# Patient Record
Sex: Female | Born: 1990 | Race: Black or African American | Hispanic: No | Marital: Single | Smoking: Never smoker
Health system: Southern US, Community
[De-identification: ages and names within clinical notes are randomized; demographics above are authoritative.]

---

## 2022-02-22 ENCOUNTER — Other Ambulatory Visit: Payer: Self-pay

## 2022-02-22 ENCOUNTER — Observation Stay
Admission: EM | Admit: 2022-02-22 | Discharge: 2022-02-24 | Disposition: A | Discharge status: Home / Self Care | Payer: Self-pay | Attending: Internal Medicine | Admitting: Internal Medicine

## 2022-02-22 ENCOUNTER — Emergency Department: Payer: Self-pay

## 2022-02-22 DIAGNOSIS — R0789 Other chest pain: Secondary | ICD-10-CM | POA: Insufficient documentation

## 2022-02-22 DIAGNOSIS — E785 Hyperlipidemia, unspecified: Secondary | ICD-10-CM | POA: Insufficient documentation

## 2022-02-22 DIAGNOSIS — E66813 Obesity, class 3: Secondary | ICD-10-CM | POA: Diagnosis present

## 2022-02-22 DIAGNOSIS — E1169 Type 2 diabetes mellitus with other specified complication: Principal | ICD-10-CM | POA: Insufficient documentation

## 2022-02-22 DIAGNOSIS — I10 Essential (primary) hypertension: Secondary | ICD-10-CM | POA: Insufficient documentation

## 2022-02-22 DIAGNOSIS — R079 Chest pain, unspecified: Secondary | ICD-10-CM | POA: Diagnosis present

## 2022-02-22 DIAGNOSIS — E119 Type 2 diabetes mellitus without complications: Secondary | ICD-10-CM

## 2022-02-22 HISTORY — DX: Morbid (severe) obesity due to excess calories: E66.01

## 2022-02-22 LAB — TROPONIN I (HIGH SENSITIVITY)
Troponin I (High Sensitivity): 13 ng/L (ref <18)
Troponin I (High Sensitivity): 27 ng/L — ABNORMAL HIGH (ref <18)
Troponin I (High Sensitivity): 33 ng/L — ABNORMAL HIGH (ref <18)

## 2022-02-22 LAB — POC URINE PREG, ED: Preg Test, Ur: NEGATIVE

## 2022-02-22 LAB — BASIC METABOLIC PANEL
Anion gap: 11 (ref 5–15)
BUN: 9 mg/dL (ref 6–20)
CO2: 22 mmol/L (ref 22–32)
Calcium: 9.1 mg/dL (ref 8.9–10.3)
Chloride: 101 mmol/L (ref 98–111)
Creatinine, Ser: 0.7 mg/dL (ref 0.44–1.00)
GFR, Estimated: >60 mL/min (ref >60)
Glucose, Bld: 429 mg/dL — ABNORMAL HIGH (ref 70–99)
Potassium: 3.9 mmol/L (ref 3.5–5.1)
Sodium: 134 mmol/L — ABNORMAL LOW (ref 135–145)

## 2022-02-22 LAB — HEMOGLOBIN A1C
Hgb A1c MFr Bld: 13.4 % — ABNORMAL HIGH (ref 4.8–5.6)
Mean Plasma Glucose: 337.88 mg/dL

## 2022-02-22 LAB — CBC
HCT: 46.1 % — ABNORMAL HIGH (ref 36.0–46.0)
Hemoglobin: 15.8 g/dL — ABNORMAL HIGH (ref 12.0–15.0)
MCH: 29.8 pg (ref 26.0–34.0)
MCHC: 34.3 g/dL (ref 30.0–36.0)
MCV: 87 fL (ref 80.0–100.0)
Platelets: 262 10*3/uL (ref 150–400)
RBC: 5.3 MIL/uL — ABNORMAL HIGH (ref 3.87–5.11)
RDW: 12.1 % (ref 11.5–15.5)
WBC: 8.1 10*3/uL (ref 4.0–10.5)
nRBC: 0 % (ref 0.0–0.2)

## 2022-02-22 LAB — HIV ANTIBODY (ROUTINE TESTING W REFLEX): HIV Screen 4th Generation wRfx: NONREACTIVE

## 2022-02-22 LAB — CBG MONITORING, ED: Glucose-Capillary: 340 mg/dL — ABNORMAL HIGH (ref 70–99)

## 2022-02-22 MED ORDER — ASPIRIN 81 MG PO CHEW
324.0000 mg | CHEWABLE_TABLET | Freq: Once | ORAL | Status: DC
  Filled 2022-02-22: qty 4

## 2022-02-22 MED ORDER — INSULIN ASPART 100 UNIT/ML IJ SOLN
0.0000 [IU] | Freq: Three times a day (TID) | INTRAMUSCULAR | Status: DC
  Administered 2022-02-22 – 2022-02-23 (×2): 7 [IU] via SUBCUTANEOUS
  Administered 2022-02-23: 5 [IU] via SUBCUTANEOUS
  Administered 2022-02-23: 9 [IU] via SUBCUTANEOUS
  Administered 2022-02-24: 3 [IU] via SUBCUTANEOUS
  Administered 2022-02-24: 7 [IU] via SUBCUTANEOUS
  Filled 2022-02-22 (×6): qty 1

## 2022-02-22 MED ORDER — ENOXAPARIN SODIUM 60 MG/0.6ML IJ SOSY: 0.5000 mg/kg | PREFILLED_SYRINGE | INTRAMUSCULAR | Status: DC

## 2022-02-22 MED ORDER — ACETAMINOPHEN 325 MG PO TABS: 650.0000 mg | ORAL_TABLET | ORAL | Status: DC | PRN

## 2022-02-22 MED ORDER — NITROGLYCERIN 0.4 MG SL SUBL: 0.4000 mg | SUBLINGUAL_TABLET | SUBLINGUAL | Status: DC | PRN

## 2022-02-22 MED ORDER — ATORVASTATIN CALCIUM 20 MG PO TABS
20.0000 mg | ORAL_TABLET | Freq: Every day | ORAL | Status: DC
  Administered 2022-02-22 – 2022-02-23 (×2): 20 mg via ORAL
  Filled 2022-02-22 (×2): qty 1

## 2022-02-22 MED ORDER — ONDANSETRON HCL 4 MG/2ML IJ SOLN: 4.0000 mg | Freq: Four times a day (QID) | INTRAMUSCULAR | Status: DC | PRN

## 2022-02-22 MED ORDER — SODIUM CHLORIDE 0.9 % IV BOLUS
1000.0000 mL | Freq: Once | INTRAVENOUS | Status: AC
  Administered 2022-02-22: 1000 mL via INTRAVENOUS

## 2022-02-22 MED ORDER — METFORMIN HCL 500 MG PO TABS
1000.0000 mg | ORAL_TABLET | Freq: Once | ORAL | Status: AC
  Administered 2022-02-22: 1000 mg via ORAL
  Filled 2022-02-22: qty 2

## 2022-02-22 MED ORDER — ASPIRIN EC 81 MG PO TBEC
81.0000 mg | DELAYED_RELEASE_TABLET | Freq: Every day | ORAL | Status: DC
  Administered 2022-02-23 – 2022-02-24 (×2): 81 mg via ORAL
  Filled 2022-02-22 (×2): qty 1

## 2022-02-22 MED ORDER — ZOLPIDEM TARTRATE 5 MG PO TABS: 5.0000 mg | ORAL_TABLET | Freq: Every evening | ORAL | Status: DC | PRN

## 2022-02-22 NOTE — ED Provider Notes (Signed)
Medical screening examination/treatment/procedure(s) were conducted as a shared visit with non-physician practitioner(s) and myself.  I personally evaluated the patient during the encounter. ? ?  ?Delman Kitten, MD ?02/22/22 1550 ? ?

## 2022-02-22 NOTE — Assessment & Plan Note (Signed)
Body mass index is 43.6 kg/m?Marland Kitchen ?Complicates overall care and prognosis.  Recommend lifestyle modifications including physical activity and diet for weight loss and overall long-term health. ? ?

## 2022-02-22 NOTE — ED Provider Notes (Addendum)
HEAR Score: 2  ? ?Patient's heart path indicates recommendation for admission for observation.  The patient has a significant delta rise in her troponin.  Admission consult has been placed with Dr. Esaw Grandchild, hospitalist. ?  Sharyn Creamer, MD ?02/22/22 1507 ? ? ?----------------------------------------- ?3:38 PM on 02/22/2022 ?----------------------------------------- ?Dr. Denton Lank, hospitalist has requested a third troponin be sent and reassessment of admission.  I did advocate and recommended strongly the patient be admitted due to a recommendation for chest pain observation via the heart pathway and rise in her troponin.  At this time, await formal consultation from Dr. Denton Lank and repeat troponin.  Dr. Lenard Lance to follow-up on this discussion, but again recommendation is for admission to the hospital, which I conveyed to the hospitalist.  ? ?Patient patient reports she did take aspirin prior to arrival today ?  ?Sharyn Creamer, MD ?02/22/22 1539 ? ?  ?Sharyn Creamer, MD ?02/22/22 1539 ? ?

## 2022-02-22 NOTE — H&P (Addendum)
?History and Physical  ? ? ?Patient: Julie Jenkins [id number] DOB: 07/26/1991 ?DOA: 02/22/2022 ?DOS: the patient was seen and examined on 02/22/2022 ?PCP: Pcp, No  ?Patient coming from: Home ? ?Chief Complaint:  ?Chief Complaint  ?Patient presents with  ? Shortness of Breath  ? Chest Pain  ? ?HPI: Julie Jenkins is a 31 y.o. female with medical history significant of morbid obesity presented to the ED today with ongoing exertional chest pain for about 1 week.  Patient reports initial onset last Saturday when walking into work, resolved spontaneously after a few minutes.  She describes it as midsternal, sharp at times usually stops when she rests.  Denies any shortness of breath nausea vomiting, dizziness lightheadedness, diaphoresis.  She does report that some of these episodes have happened when she has been emotional or upset, however she is also noted onset during physical exertion at work.  Episodes usually self resolve with rest and trying to relax.  Longest episode around 20 minutes. ? ?No personal cardiac history, but does report sister age 64 who required cardiac stent recently.  Also reports mother and maternal aunt with history of heart failure, mother possibly blood clot or peripheral vascular disease (unclear).  No personal history of blood clots.  Not currently on any hormonal birth control.  She denies any prior diagnosis of diabetes. ? ?In the ED, patient's labs showed initial normal troponin of 13, 2 hours later minimally elevated at 27.  EKG normal sinus rhythm without any acute ischemic changes.  Metabolic panel notable for glucose of 429 without any prior diagnosed diabetes.  CBC notable only for hemoconcentration with hemoglobin 15.8.  Chest x-ray degraded by body habitus but read as negative. ? ? ? ? ?Review of Systems: As mentioned in the history of present illness. All other systems reviewed and are negative. ? ?Past Medical History:  ?Diagnosis Date  ? Morbid obesity (HCC)   ? ? ?History  reviewed. No pertinent surgical history. ? ? ?Social History:  reports that she has never smoked. She has never used smokeless tobacco. She reports current alcohol use of about 4.0 standard drinks per week. She reports current drug use. Drug: Marijuana. ? ? ?No Known Allergies ? ? ?Family History  ?Problem Relation Age of Onset  ? Heart failure Mother   ? Peripheral vascular disease Mother   ? CAD Sister   ? Heart failure Maternal Aunt   ? ? ?Prior to Admission medications   ?Not on File  ? ? ?Physical Exam: ?Vitals:  ? 02/22/22 1530 02/22/22 1545 02/22/22 1600 02/22/22 1615  ?BP: (!) 125/92     ?Pulse: 79 90 90 92  ?Resp: 16 18 (!) 23 10  ?Temp:      ?TempSrc:      ?SpO2: 97% 98% 97% 96%  ?Weight:      ?Height:      ? ?General exam: awake, alert, no acute distress, morbidly obese ?HEENT: moist mucus membranes, hearing grossly normal  ?Respiratory system: CTAB diminished due to body habitus, no wheezes, rales or rhonchi, normal respiratory effort. ?Cardiovascular system: normal S1/S2, RRR, no pedal edema.   ?Gastrointestinal system: soft, NT, ND, no HSM felt, +bowel sounds. ?Central nervous system: A&O x3. no gross focal neurologic deficits, normal speech ?Extremities: moves all, no edema, normal tone ?Skin: dry, intact, normal temperature, normal color, No rashes, lesions or ulcers seen on visualized skin ?Psychiatry: normal mood, congruent affect, judgement and insight appear normal ? ? ? ?Data Reviewed: ? ? ? ?Labs reviewed &  notable for - Troponin 13 >> 27 ?Sodium 134 ?Glucose 429 ?Hbg 15.8 ? ?EKG - NSR 90 bpm, no ST elevation or other acute ischemic changes ? ? ?Assessment and Plan: ?* Chest pain ?History as above.  Chest pain has been both exertional and at times related to psychological stressors.  Given morbid obesity and underlying undiagnosed diabetes in addition to family cardiac history, admitted for observation and further evaluation. ?Patient free of chest pain during admission encounter. ? ?In the  ED: ?Initial troponin 13, repeat 27. ?EKG normal sinus rhythm without acute ischemic changes. ? ?-- Trend troponins ?-- Stat EKG if recurrent chest pain ?-- Sublingual nitro as needed ?-- Started on aspirin and statin  ?-- Telemetry ?-- A1c, lipid panel pending ?-- Outpatient cardiology follow-up versus consult depending on clinical course.  Will need echo, possibly stress test. ? ?Obesity, Class III, BMI 40-49.9 (morbid obesity) (HCC) ?Body mass index is 43.6 kg/m?Marland Kitchen ?Complicates overall care and prognosis.  Recommend lifestyle modifications including physical activity and diet for weight loss and overall long-term health. ? ? ?Type 2 diabetes mellitus (HCC) ?Glucose 429 in the ED ?No prior diagnosis of diabetes. ?Given metformin in the ED. ? ?-- Needs to establish PCP, TOC consulted ?-- A1c pending ?-- Sliding scale NovoLog for now ? ? ? ? ? ? Advance Care Planning:   Code Status: Full Code  ? ?Consults: none ? ?Family Communication: none ? ?Severity of Illness: ?The appropriate patient status for this patient is OBSERVATION. Observation status is judged to be reasonable and necessary in order to provide the required intensity of service to ensure the patient's safety. The patient's presenting symptoms, physical exam findings, and initial radiographic and laboratory data in the context of their medical condition is felt to place them at decreased risk for further clinical deterioration. Furthermore, it is anticipated that the patient will be medically stable for discharge from the hospital within 2 midnights of admission.  ? ?Author: ?Pennie Banter, DO ?02/22/2022 4:20 PM ? ?For on call review [email or web address].  ?

## 2022-02-22 NOTE — ED Provider Notes (Signed)
? ?[facility] ?Emergency Department Provider Note ? ?____________________________________________ ? ? Event Date/Time  ? First MD Initiated Contact with Patient 02/22/22 1212   ?  (approximate) ? ?I have reviewed the triage vital signs and the nursing notes. ? ? ?HISTORY ? ?Chief Complaint ?Shortness of Breath and Chest Pain ? ? ?HPI ?Jodie EchevariaSamantha Boccio is a 31 y.o. female with a history of no chronic medical issues presents to the emergency department for treatment and evaluation of intermittent chest pain that has been present for the past week.  She initially noted pain when she was walking into work last Saturday.  Pain lasted for a few minutes and went away.  Pain is sometimes sharp but usually midsternal pressure. Pain/pressure seems to stop when she rests. No shortness of breath, nausea, vomiting, diaphoresis, or other symptoms of concern.  No cardiac history.  No history of DVT/PE.  She is not currently on birth control.  ? ?Past Medical History:  ?Diagnosis Date  ? Morbid obesity (HCC)   ? ? ?Patient Active Problem List  ? Diagnosis Date Noted  ? Chest pain 02/22/2022  ? Type 2 diabetes mellitus (HCC) 02/22/2022  ? Obesity, Class III, BMI 40-49.9 (morbid obesity) (HCC) 02/22/2022  ? ? ?Prior to Admission medications   ?Not on File  ? ? ?Allergies ?Patient has no known allergies. ? ?Family History  ?Problem Relation Age of Onset  ? Heart failure Mother   ? Peripheral vascular disease Mother   ? CAD Sister   ? Heart failure Maternal Aunt   ? ? ?Social History ?Social History  ? ?Tobacco Use  ? Smoking status: Never  ? Smokeless tobacco: Never  ?Substance Use Topics  ? Alcohol use: Yes  ?  Alcohol/week: 4.0 standard drinks  ?  Types: 4 Cans of beer per week  ? Drug use: Yes  ?  Types: Marijuana  ?____________________________________________ ? ? ?PHYSICAL EXAM: ? ?VITAL SIGNS: ?ED Triage Vitals  ?Enc Vitals Group  ?   BP 02/22/22 1159 (!) 127/92  ?   Pulse Rate 02/22/22 1159 91  ?   Resp  02/22/22 1159 18  ?   Temp 02/22/22 1159 98.5 ?F (36.9 ?C)  ?   Temp Source 02/22/22 1159 Oral  ?   SpO2 02/22/22 1159 95 %  ?   Weight 02/22/22 1154 254 lb (115.2 kg)  ?   Height 02/22/22 1154 5\' 4"  (1.626 m)  ?   Head Circumference --   ?   Peak Flow --   ?   Pain Score 02/22/22 1154 10  ?   Pain Loc --   ?   Pain Edu? --   ?   Excl. in GC? --   ? ? ?Constitutional: Alert and oriented.  Overall well appearing and in no acute distress.  Normal mental status. ?Eyes: Conjunctivae are normal. PERRL. ?Head: Atraumatic. ?Nose: No congestion/rhinnorhea. ?Mouth/Throat: Mucous membranes are moist.  Oropharynx non-erythematous. Tongue normal in size and color. ?Neck: No stridor.  No carotid bruit appreciated on exam. ?Hematological/Lymphatic/Immunilogical: No cervical lymphadenopathy. ?Cardiovascular: Normal rate, regular rhythm. Grossly normal heart sounds.  Good peripheral circulation. ?Respiratory: Normal respiratory effort.  No retractions. Lungs CTAB. ?Gastrointestinal: Soft and nontender. No distention. No abdominal bruits. No CVA tenderness. ?Genitourinary: Exam deferred. ?Musculoskeletal: No lower extremity tenderness.  No edema of extremities. ?Neurologic:  Normal speech and language. No gross focal neurologic deficits are appreciated. ?Skin:  Skin is warm, dry and intact. No rash noted. ?Psychiatric: Mood and affect  are normal. Speech and behavior are normal. ? ?____________________________________________ ?  ?LABS ?(all labs ordered are listed, but only abnormal results are displayed) ? ?Labs Reviewed  ?BASIC METABOLIC PANEL - Abnormal; Notable for the following components:  ?    Result Value  ? Sodium 134 (*)   ? Glucose, Bld 429 (*)   ? All other components within normal limits  ?CBC - Abnormal; Notable for the following components:  ? RBC 5.30 (*)   ? Hemoglobin 15.8 (*)   ? HCT 46.1 (*)   ? All other components within normal limits  ?CBG MONITORING, ED - Abnormal; Notable for the following components:  ?  Glucose-Capillary 340 (*)   ? All other components within normal limits  ?TROPONIN I (HIGH SENSITIVITY) - Abnormal; Notable for the following components:  ? Troponin I (High Sensitivity) 27 (*)   ? All other components within normal limits  ?HIV ANTIBODY (ROUTINE TESTING W REFLEX)  ?HEMOGLOBIN A1C  ?LIPID PANEL  ?CBC  ?BASIC METABOLIC PANEL  ?POC URINE PREG, ED  ?TROPONIN I (HIGH SENSITIVITY)  ?TROPONIN I (HIGH SENSITIVITY)  ? ?____________________________________________ ? ?EKG ? ?ED ECG REPORT ?I, Kem Boroughs, FNP-BC personally viewed and interpreted this ECG. ? ? Date: 02/22/2022 ? EKG Time: 1157 ? Rate: 90 ? Rhythm: normal EKG, normal sinus rhythm ? Axis: normal ? Intervals:none ? ST&T Change: no ? ?____________________________________________ ? ?RADIOLOGY ? ?ED MD interpretation: Chest x-ray shows no acute cardiopulmonary abnormality. ? ?I, Kem Boroughs, personally viewed and evaluated these images (plain radiographs) as part of my medical decision making, as well as reviewing the written report by the radiologist. ? ?Official radiology report(s): ?DG Chest 2 View ? ?Result Date: 02/22/2022 ?CLINICAL DATA:  Chest pain and shortness of breath. EXAM: CHEST - 2 VIEW COMPARISON:  None. FINDINGS: Examination is degraded due to patient body habitus. Normal cardiac silhouette and mediastinal contours. No discrete focal airspace opacities. No pleural effusion or pneumothorax. No evidence of edema. No acute osseous abnormalities. IMPRESSION: No acute cardiopulmonary disease on this body habitus degraded examination. Electronically Signed   By: Simonne Come M.D.   On: 02/22/2022 12:14   ? ?____________________________________________ ? ? ?PROCEDURES ? ?Procedure(s) performed: None ? ?Procedures ? ?Critical Care performed: No ? ?____________________________________________ ? ? ?INITIAL IMPRESSION / ASSESSMENT AND PLAN  ? ?31 year old female presenting to the emergency department for treatment and evaluation of  intermittent chest pain that has been present off and on since last Saturday that is worse with exertion.  See HPI for further details. ? ?Exam is overall reassuring.  PERC negative. ?  ?Differential diagnosis includes, but not limited to: ? ?Differential includes, but is not limited to, viral syndrome, bronchitis including COPD exacerbation, reactive airway disease including asthma, CHF including exacerbation with or without pulmonary/interstitial edema, pneumothorax, ACS, thoracic trauma, and pulmonary embolism, ACS, aortic dissection, pulmonary embolism, cardiac tamponade, pneumothorax, pneumonia, pericarditis, myocarditis, GI-related causes including esophagitis/gastritis, and musculoskeletal chest wall pain.   ? ?ED COURSE ? ?Labs reviewed and are significant for a glucose of 429 within normal anion gap.  CBC unremarkable.  Troponin is normal.  Chest x-ray is without acute cardiopulmonary abnormality.  EKG is unremarkable showing a normal sinus rhythm. ? ?Symptoms most likely related to new diagnosis of type 2 diabetes.  We will give 1 L of fluids, recheck CBG and plan to start her on metformin.  Lab results discussed with the patient.  She was advised that she will need to establish primary care.  She does not  currently have any medical insurance.  And a list of community resources will be provided.  Dietary changes were discussed with the patient as well. ? ?----------------------------------------- ?2:40 PM on 02/22/2022 ?----------------------------------------- ?Repeat troponin elevated from 13 to 27. Discussed with ED attending, Dr. Fanny Bien who recommends admission for exertional chest pain and new onset diabetes. Plan discussed with patient who agrees to stay. Hospitalist consult requested. ?  ? ?FINAL CLINICAL IMPRESSION(S) / ED DIAGNOSES ? ?Final diagnoses:  ?New onset type 2 diabetes mellitus (HCC)  ?Chest pain, unspecified type  ? ? ?ED Discharge Orders   ? ?      Ordered  ?  POCT CBG (Fasting -  Glucose)       ? 02/22/22 1413  ? ?  ?  ? ?  ? ? ?As part of my medical decision making, I reviewed the following data within the electronic MEDICAL RECORD NUMBER Labs reviewed , EKG interpreted NSR, and Evaluated by EM attendin

## 2022-02-22 NOTE — Assessment & Plan Note (Addendum)
History as above.  Chest pain has been both exertional and at times related to psychological stressors.  Given morbid obesity and underlying undiagnosed diabetes in addition to family cardiac history, admitted for observation and further evaluation. ?Troponin trended peaked at 33, normalized this morning at 11. ?Echocardiogram with normal EF, no diastolic dysfunction or valvular pathology ? ?-- Started on aspirin and statin  ?-- Outpatient cardiology follow-up ?

## 2022-02-22 NOTE — ED Triage Notes (Signed)
Pt comes pov with cp and shob since last Saturday. Upper left chest pain waving across chest. Pain to back as well.  ?

## 2022-02-22 NOTE — Assessment & Plan Note (Addendum)
Glucose 429 in the ED. ?Hemoglobin A1c 13.4% ?No prior diagnosis of diabetes. ?Given metformin in the ED. ? ?-- Does not have PCP, TOC consulted ?-- Discharging on Basaglar 15 units twice daily and metformin 500 mg twice daily ?--Patient advised to keep a log of blood sugar readings at home to bring to primary care follow-up.  She is agreeable ?-- Diabetes educator consulted ?

## 2022-02-22 NOTE — Progress Notes (Signed)
PHARMACIST - PHYSICIAN COMMUNICATION ? ?CONCERNING:  Enoxaparin (Lovenox) for DVT Prophylaxis  ? ?DESCRIPTION: ?Patient was prescribed enoxaprin 40mg  q24 hours for VTE prophylaxis.  ? Weights  ? 02/22/22 1154  ?Weight: 115.2 kg (254 lb)  ? ? ?Body mass index is 43.6 kg/m?. ? ?Estimated Creatinine Clearance: 125.7 mL/min (by C-G formula based on SCr of 0.7 mg/dL). ? ? ?Based on [facility] policy patient is candidate for enoxaparin 0.5mg /kg TBW SQ every 24 hours based on BMI being >30. ? ?RECOMMENDATION: ?Pharmacy has adjusted enoxaparin dose per [facility] policy. ? ?Patient is now receiving enoxaparin 57.5 mg every 24 hours  ? ? ?[facility], PharmD ?Clinical Pharmacist  ?02/22/2022 ?3:34 PM ? ?

## 2022-02-23 ENCOUNTER — Observation Stay (HOSPITAL_BASED_OUTPATIENT_CLINIC_OR_DEPARTMENT_OTHER)
Admit: 2022-02-23 | Discharge: 2022-02-23 | Disposition: A | Discharge status: Home / Self Care | Payer: Self-pay | Attending: Internal Medicine | Admitting: Internal Medicine

## 2022-02-23 DIAGNOSIS — E1165 Type 2 diabetes mellitus with hyperglycemia: Secondary | ICD-10-CM

## 2022-02-23 DIAGNOSIS — E785 Hyperlipidemia, unspecified: Secondary | ICD-10-CM

## 2022-02-23 DIAGNOSIS — I10 Essential (primary) hypertension: Secondary | ICD-10-CM | POA: Diagnosis present

## 2022-02-23 DIAGNOSIS — R079 Chest pain, unspecified: Secondary | ICD-10-CM

## 2022-02-23 DIAGNOSIS — E1169 Type 2 diabetes mellitus with other specified complication: Secondary | ICD-10-CM

## 2022-02-23 LAB — CBC
HCT: 43.9 % (ref 36.0–46.0)
Hemoglobin: 15 g/dL (ref 12.0–15.0)
MCH: 29.8 pg (ref 26.0–34.0)
MCHC: 34.2 g/dL (ref 30.0–36.0)
MCV: 87.3 fL (ref 80.0–100.0)
Platelets: 247 10*3/uL (ref 150–400)
RBC: 5.03 MIL/uL (ref 3.87–5.11)
RDW: 12.5 % (ref 11.5–15.5)
WBC: 8.1 10*3/uL (ref 4.0–10.5)
nRBC: 0 % (ref 0.0–0.2)

## 2022-02-23 LAB — BASIC METABOLIC PANEL
Anion gap: 8 (ref 5–15)
BUN: 8 mg/dL (ref 6–20)
CO2: 25 mmol/L (ref 22–32)
Calcium: 8.8 mg/dL — ABNORMAL LOW (ref 8.9–10.3)
Chloride: 102 mmol/L (ref 98–111)
Creatinine, Ser: 0.62 mg/dL (ref 0.44–1.00)
GFR, Estimated: >60 mL/min (ref >60)
Glucose, Bld: 308 mg/dL — ABNORMAL HIGH (ref 70–99)
Potassium: 3.8 mmol/L (ref 3.5–5.1)
Sodium: 135 mmol/L (ref 135–145)

## 2022-02-23 LAB — LIPID PANEL
Cholesterol: 209 mg/dL — ABNORMAL HIGH (ref 0–200)
HDL: 24 mg/dL — ABNORMAL LOW (ref >40)
LDL Cholesterol: 123 mg/dL — ABNORMAL HIGH (ref 0–99)
Total CHOL/HDL Ratio: 8.7 RATIO
Triglycerides: 310 mg/dL — ABNORMAL HIGH (ref <150)
VLDL: 62 mg/dL — ABNORMAL HIGH (ref 0–40)

## 2022-02-23 LAB — ECHOCARDIOGRAM COMPLETE
AR max vel: 1.99 cm2
AV Peak grad: 5.6 mmHg
Ao pk vel: 1.18 m/s
Area-P 1/2: 3.53 cm2
Calc EF: 57.1 %
Height: 64 in
S' Lateral: 2.85 cm
Single Plane A2C EF: 60.7 %
Single Plane A4C EF: 56.4 %
Weight: 4064 oz

## 2022-02-23 LAB — GLUCOSE, CAPILLARY
Glucose-Capillary: 300 mg/dL — ABNORMAL HIGH (ref 70–99)
Glucose-Capillary: 317 mg/dL — ABNORMAL HIGH (ref 70–99)
Glucose-Capillary: 370 mg/dL — ABNORMAL HIGH (ref 70–99)
Glucose-Capillary: 326 mg/dL — ABNORMAL HIGH (ref 70–99)
Glucose-Capillary: 273 mg/dL — ABNORMAL HIGH (ref 70–99)
Glucose-Capillary: 222 mg/dL — ABNORMAL HIGH (ref 70–99)
Glucose-Capillary: 234 mg/dL — ABNORMAL HIGH (ref 70–99)

## 2022-02-23 LAB — TROPONIN I (HIGH SENSITIVITY): Troponin I (High Sensitivity): 11 ng/L (ref <18)

## 2022-02-23 MED ORDER — METFORMIN HCL 500 MG PO TABS
500.0000 mg | ORAL_TABLET | Freq: Two times a day (BID) | ORAL | Status: DC
  Administered 2022-02-23 – 2022-02-24 (×2): 500 mg via ORAL
  Filled 2022-02-23 (×2): qty 1

## 2022-02-23 MED ORDER — PERFLUTREN LIPID MICROSPHERE
1.0000 mL | INTRAVENOUS | Status: AC | PRN
  Administered 2022-02-23: 3 mL via INTRAVENOUS
  Filled 2022-02-23: qty 10

## 2022-02-23 MED ORDER — LOSARTAN POTASSIUM 25 MG PO TABS
25.0000 mg | ORAL_TABLET | Freq: Every day | ORAL | Status: DC
  Administered 2022-02-23: 25 mg via ORAL
  Filled 2022-02-23: qty 1

## 2022-02-23 MED ORDER — ATORVASTATIN CALCIUM 20 MG PO TABS
40.0000 mg | ORAL_TABLET | Freq: Every day | ORAL | Status: DC
  Administered 2022-02-24: 40 mg via ORAL
  Filled 2022-02-23: qty 2

## 2022-02-23 MED ORDER — INSULIN DETEMIR 100 UNIT/ML ~~LOC~~ SOLN
10.0000 [IU] | Freq: Two times a day (BID) | SUBCUTANEOUS | Status: DC
  Administered 2022-02-23: 10 [IU] via SUBCUTANEOUS
  Filled 2022-02-23 (×2): qty 0.1

## 2022-02-23 MED ORDER — INSULIN DETEMIR 100 UNIT/ML ~~LOC~~ SOLN
8.0000 [IU] | Freq: Every day | SUBCUTANEOUS | Status: DC
  Administered 2022-02-23: 8 [IU] via SUBCUTANEOUS
  Filled 2022-02-23: qty 0.08

## 2022-02-23 NOTE — Hospital Course (Signed)
Julie Jenkins is a 31 y.o. female with medical history significant of morbid obesity presented to the ED today with ongoing exertional chest pain for about 1 week.   ? ?In the ED, initial hs-troponin normal at 13, repeat mildly elevated 27.  EKG unremarkable.  Labs notable for glucose of 429 without any prior diagnosis diabetes. ? ?Admitted for observation and further evaluation of chest pain.  Troponin peaked at 33 and normalized.  Echocardiogram unremarkable, normal EF and no diastolic dysfunction or valvular pathology. ? ?Regarding new onset diabetes, hemoglobin A1c 13.4%. ?Starting on insulin and metformin, diabetes educator consulted.  TOC consulted for assistance with PCP and medications given patient is uninsured. ? ? ?

## 2022-02-23 NOTE — Progress Notes (Signed)
?Progress Note ? ? ?Patient: Julie Jenkins [id number]:[id number] DOB: [birth year] DOA: 02/22/2022     0 ?DOS: the patient was seen and examined on 02/23/2022 ?  ?Brief hospital course: ?Julie Jenkins is a 31 y.o. female with medical history significant of morbid obesity presented to the ED today with ongoing exertional chest pain for about 1 week.   ? ?In the ED, initial hs-troponin normal at 13, repeat mildly elevated 27.  EKG unremarkable.  Labs notable for glucose of 429 without any prior diagnosis diabetes. ? ?Admitted for observation and further evaluation of chest pain.  Troponin peaked at 33 and normalized.  Echocardiogram unremarkable, normal EF and no diastolic dysfunction or valvular pathology. ? ?Regarding new onset diabetes, hemoglobin A1c 13.4%. ?Starting on insulin and metformin, diabetes educator consulted.  TOC consulted for assistance with PCP and medications given patient is uninsured. ? ? ? ?Assessment and Plan: ?* Chest pain ?History as above.  Chest pain has been both exertional and at times related to psychological stressors.  Given morbid obesity and underlying undiagnosed diabetes in addition to family cardiac history, admitted for observation and further evaluation. ?Troponin trended peaked at 33, normalized this morning at 11. ?Echocardiogram with normal EF, no diastolic dysfunction or valvular pathology ? ?3/26: Patient reported a brief episode of chest discomfort when she was up to the bathroom this morning ? ?-- Stat EKG if recurrent chest pain ?-- Sublingual nitro as needed ?-- Started on aspirin and statin  ?-- Telemetry ?-- Outpatient cardiology follow-up ? ?Essential hypertension ?No prior formal diagnosis.   ?BPs have been in systolic 150s to 546T, diastolic upper 80s to as high as 115. ?--Started on losartan ?-- BP and adjust as needed ?--Close primary care follow-up ? ?Of note, BP was controlled on admission, elevated BP could be related to stress/anxiety of hospital admission.  Caution  not to overtreat.  Recommend ambulatory BP monitoring at home.   ? ?Hyperlipidemia due to type 2 diabetes mellitus (HCC) ?Started on Lipitor 40 mg ? ?Obesity, Class III, BMI 40-49.9 (morbid obesity) (HCC) ?Body mass index is 43.6 kg/m?Marland Kitchen ?Complicates overall care and prognosis.  Recommend lifestyle modifications including physical activity and diet for weight loss and overall long-term health. ? ? ?Type 2 diabetes mellitus (HCC) ?Glucose 429 in the ED. ?Hemoglobin A1c 13.4% ?No prior diagnosis of diabetes. ?Given metformin in the ED. ? ?-- Needs to establish PCP, TOC consulted ?-- Uninsured, prescriptions to medication management on discharge ?--Start Levemir 8 units this morning, this afternoon increased to 10 units twice daily given glucose still in the mid 300s ?-- Sliding scale NovoLog ?-- Started on metformin 500 mg twice daily ?-- Monitor CBGs ? ? ? ? ?  ? ?Subjective: Patient awake sitting up in bed when seen this morning.  Reports having a episode of chest discomfort when she was up to the bathroom earlier this morning it resolved more quickly than prior episodes.  She sat down to rest and take deep breaths and it subsided.  Denies any shortness of breath dizziness lightheadedness or diaphoresis with this.  She seems a little overwhelmed by the diabetes diagnosis and idea of being on insulin.  Assured diabetes educator will assist in preparing her for this. ? ? ?Physical Exam: ?Vitals:  ? 02/22/22 2331 02/23/22 0454 02/23/22 0752 02/23/22 1204  ?BP: (!) 162/90 (!) 163/115 (!) 155/99 (!) 157/88  ?Pulse: 64 76 72 68  ?Resp: 19 19 19 18   ?Temp: 98.5 ?F (36.9 ?C) 97.6 ?F (36.4 ?C) 97.9 ?F (  36.6 ?C) 98.4 ?F (36.9 ?C)  ?TempSrc:      ?SpO2: 96% 100% 100% 98%  ?Weight:      ?Height:      ? ?General exam: awake, alert, no acute distress, obese ?HEENT: moist mucus membranes, hearing grossly normal  ?Respiratory system: normal respiratory effort at rest, on room air. ?Cardiovascular system: normal S1/S2, RRR, no pedal  edema.   ?Central nervous system: A&O x3. no gross focal neurologic deficits, normal speech ?Extremities: moves all, no edema, normal tone ?Skin: dry, intact, normal temperature ?Psychiatry: normal mood, congruent affect, judgement and insight appear normal ? ? ? ?Data Reviewed: ? ?Labs reviewed notable for troponin normalized 11, glucose 308 on metabolic panel with CBGs 300, 176, 370, 326.  Lipid panel cholesterol 209, HDL 24, LDL 123, triglycerides 310, VLDL 62.  CBC normal.  Hemoglobin A1c 13.4% ? ?Echocardiogram: ? 1. Left ventricular ejection fraction, by estimation, is 60 to 65%. The  ?left ventricle has normal function. The left ventricle has no regional  ?wall motion abnormalities. Left ventricular diastolic parameters were  ?normal.  ? 2. Right ventricular systolic function is normal. The right ventricular  ?size is normal. There is normal pulmonary artery systolic pressure. The  ?estimated right ventricular systolic pressure is 24.5 mmHg.  ? 3. Left atrial size was mildly dilated.  ? 4. The mitral valve is normal in structure. No evidence of mitral valve  ?regurgitation. No evidence of mitral stenosis.  ? 5. The aortic valve was not well visualized. Aortic valve regurgitation  ?is not visualized. No aortic stenosis is present.  ? 6. The inferior vena cava is normal in size with greater than 50%  ?respiratory variability, suggesting right atrial pressure of 3 mmHg.  ? ? ? ?Disposition: ?Status is: Observation ?The patient remains OBS appropriate and will d/c before 2 midnights. ?-- Blood sugars remain uncontrolled, starting on insulin given A1c above 13 ?--Requires diabetes education ?--Med management not open on weekends for patient to get prescriptions today ? ? ? Planned Discharge Destination: Home ? ? ? ?Time spent: 35 minutes ? ?Author: ?Pennie Banter, DO ?02/23/2022 3:51 PM ? ?For on call review [email or web address].  ?

## 2022-02-23 NOTE — Assessment & Plan Note (Signed)
Started on Lipitor 40 mg ?

## 2022-02-23 NOTE — Progress Notes (Signed)
*  PRELIMINARY RESULTS* ?Echocardiogram ?2D Echocardiogram has been performed. Definity IV ultrasound enhancing agent used on this study. ? ?Julie Jenkins ?02/23/2022, 10:24 AM ?

## 2022-02-23 NOTE — Assessment & Plan Note (Addendum)
No prior formal diagnosis.   ?BPs have been in systolic 150s to 546E, diastolic upper 80s to as high as 115. ? ?--Started on losartan and HCTZ ?--Close primary care follow-up ?

## 2022-02-24 ENCOUNTER — Other Ambulatory Visit: Payer: Self-pay

## 2022-02-24 DIAGNOSIS — I10 Essential (primary) hypertension: Secondary | ICD-10-CM

## 2022-02-24 LAB — GLUCOSE, CAPILLARY
Glucose-Capillary: 268 mg/dL — ABNORMAL HIGH (ref 70–99)
Glucose-Capillary: 306 mg/dL — ABNORMAL HIGH (ref 70–99)

## 2022-02-24 MED ORDER — INSULIN STARTER KIT- PEN NEEDLES (ENGLISH)
1.0000 | Freq: Once | Status: AC
  Administered 2022-02-24: 1
  Filled 2022-02-24: qty 1

## 2022-02-24 MED ORDER — INSULIN PEN NEEDLE 32G X 4 MM MISC
1.0000 | Freq: Three times a day (TID) | 1 refills | Status: AC
  Filled 2022-02-24: qty 100, 25d supply, fill #0

## 2022-02-24 MED ORDER — METHOCARBAMOL 500 MG PO TABS: 500.0000 mg | ORAL_TABLET | Freq: Three times a day (TID) | ORAL | Status: DC | PRN

## 2022-02-24 MED ORDER — LOSARTAN POTASSIUM 50 MG PO TABS
50.0000 mg | ORAL_TABLET | Freq: Every day | ORAL | Status: DC
  Administered 2022-02-24: 50 mg via ORAL
  Filled 2022-02-24: qty 1

## 2022-02-24 MED ORDER — LIVING WELL WITH DIABETES BOOK
Freq: Once | Status: AC
  Filled 2022-02-24: qty 1

## 2022-02-24 MED ORDER — INSULIN DETEMIR 100 UNIT/ML ~~LOC~~ SOLN: 20.0000 [IU] | Freq: Two times a day (BID) | SUBCUTANEOUS | Status: DC

## 2022-02-24 MED ORDER — METHOCARBAMOL 500 MG PO TABS
500.0000 mg | ORAL_TABLET | Freq: Three times a day (TID) | ORAL | 0 refills | Status: DC | PRN
  Filled 2022-02-24: qty 30, 10d supply, fill #0

## 2022-02-24 MED ORDER — HYDROCHLOROTHIAZIDE 25 MG PO TABS
25.0000 mg | ORAL_TABLET | Freq: Every day | ORAL | Status: DC
  Administered 2022-02-24: 25 mg via ORAL
  Filled 2022-02-24: qty 1

## 2022-02-24 MED ORDER — RIGHTEST GL300 LANCETS MISC
0 refills | Status: AC
  Filled 2022-02-24: qty 100, 25d supply, fill #0

## 2022-02-24 MED ORDER — INSULIN DETEMIR 100 UNIT/ML ~~LOC~~ SOLN
15.0000 [IU] | Freq: Two times a day (BID) | SUBCUTANEOUS | Status: DC
  Administered 2022-02-24: 15 [IU] via SUBCUTANEOUS
  Filled 2022-02-24 (×2): qty 0.15

## 2022-02-24 MED ORDER — BASAGLAR KWIKPEN 100 UNIT/ML ~~LOC~~ SOPN
15.0000 [IU] | PEN_INJECTOR | Freq: Two times a day (BID) | SUBCUTANEOUS | 2 refills | Status: AC
  Filled 2022-02-24: qty 15, 50d supply, fill #0

## 2022-02-24 MED ORDER — RIGHTEST GM550 BLOOD GLUCOSE W/DEVICE KIT
PACK | 0 refills | Status: AC
  Filled 2022-02-24: qty 1, 30d supply, fill #0

## 2022-02-24 MED ORDER — ASPIRIN 81 MG PO TBEC
81.0000 mg | DELAYED_RELEASE_TABLET | Freq: Every day | ORAL | 2 refills | Status: AC
  Filled 2022-02-24: qty 30, 30d supply, fill #0

## 2022-02-24 MED ORDER — LOSARTAN POTASSIUM-HCTZ 100-25 MG PO TABS
1.0000 | ORAL_TABLET | Freq: Every day | ORAL | 2 refills | Status: AC
  Filled 2022-02-24: qty 30, 30d supply, fill #0

## 2022-02-24 MED ORDER — HYDRALAZINE HCL 20 MG/ML IJ SOLN: 5.0000 mg | Freq: Four times a day (QID) | INTRAMUSCULAR | Status: DC | PRN

## 2022-02-24 MED ORDER — RIGHTEST GS550 BLOOD GLUCOSE VI STRP
ORAL_STRIP | 0 refills | Status: AC
  Filled 2022-02-24: qty 100, 25d supply, fill #0

## 2022-02-24 MED ORDER — METFORMIN HCL 500 MG PO TABS
500.0000 mg | ORAL_TABLET | Freq: Two times a day (BID) | ORAL | 2 refills | Status: AC
  Filled 2022-02-24: qty 60, 30d supply, fill #0

## 2022-02-24 MED ORDER — BLOOD GLUCOSE MONITOR KIT: PACK | 0 refills | Status: AC

## 2022-02-24 MED ORDER — ATORVASTATIN CALCIUM 40 MG PO TABS
40.0000 mg | ORAL_TABLET | Freq: Every day | ORAL | 2 refills | Status: AC
  Filled 2022-02-24: qty 30, 30d supply, fill #0

## 2022-02-24 NOTE — Progress Notes (Signed)
Pt Discharged, all belongings with pt. ?

## 2022-02-24 NOTE — Progress Notes (Signed)
Inpatient Diabetes Program Recommendations ? ?AACE/ADA: New Consensus Statement on Inpatient Glycemic Control  ? ?Target Ranges:  Prepandial:   less than 140 mg/dL ?     Peak postprandial:   less than 180 mg/dL (1-2 hours) ?     Critically ill patients:  140 - 180 mg/dL  ? ? Latest Reference Range & Units 02/24/22 08:50 02/24/22 11:21  ?Glucose-Capillary 70 - 99 mg/dL 268 (H) 306 (H)  ? ? Latest Reference Range & Units 02/22/22 12:01 02/22/22 16:38 02/23/22 04:50  ?Glucose 70 - 99 mg/dL 429 (H)  308 (H)  ?Hemoglobin A1C 4.8 - 5.6 %  13.4 (H)   ? ?Review of Glycemic Control ? ?Diabetes history: No (newly dx with DM this admission) ?Outpatient Diabetes medications: NA ?Current orders for Inpatient glycemic control: Levemir 15 units BID, Novolog 0-9 units TID with meals, Metformin 500 mg BID ? ?NOTE: Spoke with patient about new diabetes diagnosis.  Patient reports that she has several family members with DM and she has worked at a [employer] so she has checked glucose of other people before but not her own.  Discussed A1C results (13.4% on 02/22/22) and explained what an A1C is and informed patient that current A1C indicates an average glucose of 338 mg/dl over the past 2-3 months. Discussed basic pathophysiology of DM Type 2, basic home care, importance of checking CBGs and maintaining good CBG control to prevent long-term and short-term complications. Reviewed glucose and A1C goals.  Reviewed signs and symptoms of hyperglycemia and hypoglycemia along with treatment for both. Discussed impact of nutrition, exercise, stress, sickness, and medications on diabetes control. Discussed Carb Modified diet and informed patient that RD was consulted and should be talking with her as well regarding Carb modified diet. Reviewed Living Well with diabetes booklet and encouraged patient to read through entire book. Informed patient that she will be prescribed basal insulin (Basaglar) and Metformin. Discussed Basaglar and Metformin.  Informed of potential GI side effects of Metformin.  Explained that since she is uninsured, TOC has been consulted and should be assisting with follow up and medications from [facility]. Also asked TOC to provide glucometer and testing supplies.  Reviewed and demonstrated how to draw up and administer insulin with vial and syringe and how to use insulin pen. Educated patient on insulin pen use at home. Reviewed contents of insulin flexpen starter kit. Reviewed all steps of insulin pen including attachment of needle, 2-unit air shot, dialing up dose, giving injection, removing needle, disposal of sharps, storage of unused insulin, disposal of insulin etc. Patient able to provide successful return demonstration. Patient states she prefers to use insulin pens outpatient.  Asked patient to check glucose 3-4 times per day and asked that she take glucometer with her to follow up appointments. Informed patient that RN will be asking her to self-administer insulin to ensure proper technique and ability to administer self insulin shots. Patient has already given herself one insulin injection today. Encouraged patient to get established with local clinic (TOC to provide information on clinic) and consistently follow up to ensure she can continue to get needed DM medications and supplies.  Patient verbalized understanding of information discussed and she states that she has no further questions at this time related to diabetes.   RNs to provide ongoing basic DM education at bedside with this patient and engage patient to actively check blood glucose and administer insulin injections. At time of discharge please provide Rx for Basaglar pens [phone]), insulin  pen needles [phone]), and the Metformin. ? ?Thanks, ?Barnie Alderman, RN, MSN, CDE ?Diabetes Coordinator ?Inpatient Diabetes Program ?[phone] (Team Pager from 8am to 5pm) ? ? ? ? ?

## 2022-02-24 NOTE — TOC CM/SW Note (Addendum)
Patient left before glucose monitor/supplies and PCP resources could be given. Left her a voicemail. ? ?Charlynn Court, CSW ?[phone] ? ?3:59 pm: Received call back from patient. She will come pick up glucose monitor at front desk at [facility] entrance. Also included PCP packet and intake paperwork for [facility]. ? ?Charlynn Court, CSW ?[phone] ? ?

## 2022-02-24 NOTE — Discharge Summary (Addendum)
?Physician Discharge Summary ?  ?Patient: Julie Jenkins MRN: [id number] DOB: 13-Nov-1991  ?Admit date:     02/22/2022  ?Discharge date: 02/24/2022  ?Discharge Physician: Ezekiel Slocumb  ? ?PCP: Pcp, No  ? ?Recommendations at discharge:  ? ?Follow-up with primary care in 1 to 2 weeks or soonest available appointment ?Follow-up on glycemic control ?Follow-up on blood pressure control ?Repeat CBC, BMP, Mg in 1 to 2 weeks at follow-up ? ? ?Discharge Diagnoses: ?Principal Problem: ?  Chest pain ?Active Problems: ?  Type 2 diabetes mellitus ([place]) ?  Obesity, Class III, BMI 40-49.9 (morbid obesity) ([place]) ?  Hyperlipidemia due to type 2 diabetes mellitus ([place]) ?  Essential hypertension ? ? ?Hospital Course: ?Julie Jenkins is a 31 y.o. female with medical history significant of morbid obesity presented to the ED today with ongoing exertional chest pain for about 1 week.   ? ?In the ED, initial hs-troponin normal at 13, repeat mildly elevated 27.  EKG unremarkable.  Labs notable for glucose of 429 without any prior diagnosis diabetes. ? ?Admitted for observation and further evaluation of chest pain.  Troponin peaked at 33 and normalized.  Echocardiogram unremarkable, normal EF and no diastolic dysfunction or valvular pathology. ? ?Regarding new onset diabetes, hemoglobin A1c 13.4%. ?Starting on insulin and metformin, diabetes educator consulted.  TOC consulted for assistance with PCP and medications given patient is uninsured. ? ? ? ?Assessment and Plan: ?* Chest pain ?History as above.  Chest pain has been both exertional and at times related to psychological stressors.  Given morbid obesity and underlying undiagnosed diabetes in addition to family cardiac history, admitted for observation and further evaluation. ?Troponin trended peaked at 33, normalized this morning at 11. ?Echocardiogram with normal EF, no diastolic dysfunction or valvular pathology ? ?-- Started on aspirin and statin  ?-- Outpatient cardiology  follow-up ? ?Essential hypertension ?No prior formal diagnosis.   ?BPs have been in systolic 628B to 151V, diastolic upper 61Y to as high as 115. ? ?--Started on losartan and HCTZ ?--Close primary care follow-up ? 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 ?Obesity, Class III, BMI 40-49.9 (morbid obesity) ([place]) ?Body mass index is 43.6 kg/m?Marland Kitchen ?Complicates overall care and prognosis.  Recommend lifestyle modifications including physical activity and diet for weight loss and overall long-term health. ? ? ?Type 2 diabetes mellitus ([place]) ?Glucose 429 in the ED. ?Hemoglobin A1c 13.4% ?No prior diagnosis of diabetes. ?Given metformin in the ED. ? ?-- Does not have PCP, TOC consulted ?-- Discharging on Basaglar 15 units twice daily and metformin 500 mg twice daily ?--Patient advised to keep a log of blood sugar readings at home to bring to primary care follow-up.  She is agreeable ?-- Diabetes educator consulted ? ? ? ? ?  ? ? ?Consultants: Diabetes coordinator ?Procedures performed: None ?Disposition: Home ?Diet recommendation:  ?Discharge Diet Orders (From admission, onward)  ? ?  Start     Ordered  ? 02/24/22 0000  Diet - low sodium heart healthy       ? 02/24/22 1350  ? ?  ?  ? ?  ? ?Carb modified diet ?DISCHARGE MEDICATION: ?Allergies as of 02/24/2022   ?No Known Allergies ?  ? ?  ?Medication List  ?  ? ?TAKE these medications   ? ?Aspirin Adult Low Strength 81 MG EC tablet ?Generic drug: aspirin ?Take 1 tablet (81 mg total) by mouth once daily. Swallow whole. ?  ?atorvastatin 40 MG tablet ?  Commonly known as: LIPITOR ?Take 1 tablet (40 mg total) by mouth once daily. ?  ?Basaglar KwikPen 100 UNIT/ML ?Inject 15 Units into the skin 2 (two) times daily. ?  ?blood glucose meter kit and supplies Kit ?Dispense based on patient and insurance preference. Use up to four times daily as directed. ?  ?Comfort EZ Pen Needles 32G X 4 MM Misc ?Generic drug: Insulin Pen Needle ?Use as directed  with insulin pen. ?  ?losartan-hydrochlorothiazide 100-25 MG tablet ?Commonly known as: Hyzaar ?Take 1 tablet by mouth once daily. ?  ?metFORMIN 500 MG tablet ?Commonly known as: GLUCOPHAGE ?Take 1 tablet (500 mg total) by mouth 2 (two) times daily with a meal. ?  ?methocarbamol 500 MG tablet ?Commonly known as: ROBAXIN ?Take 1 tablet (500 mg total) by mouth every 8 (eight) hours as needed for muscle spasms. ?  ? ?  ? ?ASK your doctor about these medications   ? ?Rightest GL300 Lancets Misc ?Use up to 4 times daily as directed. ?  ?Rightest GM550 Blood Glucose w/Device Kit ?Use up to 4 times daily as directed. ?  ?Rightest GS550 Blood Glucose test strip ?Generic drug: glucose blood ?Use up to 4 times daily as directed. ?  ? ?  ? ? Follow-up Information   ? ? [facility]. Call.   ?Specialty: Primary Care ?Why: Establish with primary care, new diagnoses of diabetes and hypertension ?Contact information: ?[address] ?[address] ?[place] ?[phone] ? ?  ?  ? ? Minna Merritts, MD. Call.   ?Specialty: Cardiology ?Why: As needed, If symptoms worsen.  If having ongoing chest pain, heart palpitations (racing or fluttering sensation), recommend following up with cardiology for ambulatory heart monitor. ?Contact information: ?[address] ?[place] [address] ?[phone] ? ? ?  ?  ? ?  ?  ? ?  ? ?Discharge Exam: ?Julie Jenkins Weights  ? 02/22/22 1154  ?Weight: 115.2 kg  ? ?General exam: awake, alert, no acute distress, obese ?HEENT: atraumatic, clear conjunctiva, anicteric sclera, moist mucus membranes, hearing grossly normal  ?Respiratory system: CTAB, no wheezes, rales or rhonchi, normal respiratory effort. ?Cardiovascular system: normal S1/S2, RRR, no JVD, murmurs, rubs, gallops,  no pedal edema.   ?Gastrointestinal system: soft, NT, ND, no HSM felt, +bowel sounds. ?Central nervous system: A&O x4. no gross focal neurologic deficits, normal speech ?Extremities:  moves all, no edema, normal tone ?Skin: dry, intact, normal temperature, normal color, No rashes, lesions or ulcers ?Psychiatry: normal mood, congruent affect, judgement and insight appear normal ? ? ?Condition at discharge: stable ? ?The results of significant diagnostics from this hospitalization (including imaging, microbiology, ancillary and laboratory) are listed below for reference.  ? ?Imaging Studies: ?DG Chest 2 View ? ?Result Date: 02/22/2022 ?CLINICAL DATA:  Chest pain and shortness of breath. EXAM: CHEST - 2 VIEW COMPARISON:  None. FINDINGS: Examination is degraded due to patient body habitus. Normal cardiac silhouette and mediastinal contours. No discrete focal airspace opacities. No pleural effusion or pneumothorax. No evidence of edema. No acute osseous abnormalities. IMPRESSION: No acute cardiopulmonary disease on this body habitus degraded examination. Electronically Signed   By: Sandi Mariscal M.D.   On: 02/22/2022 12:14  ? ?ECHOCARDIOGRAM COMPLETE ? ?Result Date: 02/23/2022 ?   ECHOCARDIOGRAM REPORT   Patient Name:   NATACHA JEPSEN Date of Exam: 02/23/2022 Medical Rec #:  [id number]       Height:       64.0 in Accession #:  [id number]      Weight:       254.0 lb Date of Birth:  1991-06-16        BSA:          2.166 m? Patient Age:    89 years        BP:           155/99 mmHg Patient Gender: F               HR:           85 bpm. Exam Location:  [facility] Procedure: 2D Echo and Intracardiac Opacification Agent Indications:     Chest Pain R07.9  History:         Patient has no prior history of Echocardiogram examinations.  Sonographer:     Kathlen Brunswick RDCS Referring Phys:  [id number] Claiborne Billings A Jaylene Arrowood Diagnosing Phys: Ida Rogue MD  Sonographer Comments: Technically difficult study due to poor echo windows. IMPRESSIONS  1. Left ventricular ejection fraction, by estimation, is 60 to 65%. The left ventricle has normal function. The left ventricle has no regional wall motion abnormalities. Left ventricular  diastolic parameters were normal.  2. Right ventricular systolic function is normal. The right ventricular size is normal. There is normal pulmonary artery systolic pressure. The estimated right ventricular systolic

## 2022-03-07 ENCOUNTER — Other Ambulatory Visit: Payer: Self-pay

## 2022-03-07 ENCOUNTER — Encounter: Payer: Self-pay | Admitting: Emergency Medicine

## 2022-03-07 ENCOUNTER — Emergency Department
Admission: EM | Admit: 2022-03-07 | Discharge: 2022-03-07 | Disposition: A | Discharge status: Home / Self Care | Payer: Self-pay | Attending: Emergency Medicine | Admitting: Emergency Medicine

## 2022-03-07 ENCOUNTER — Emergency Department: Payer: Self-pay

## 2022-03-07 DIAGNOSIS — M940 Chondrocostal junction syndrome [Tietze]: Secondary | ICD-10-CM | POA: Insufficient documentation

## 2022-03-07 MED ORDER — MELOXICAM 15 MG PO TABS: 15.0000 mg | ORAL_TABLET | Freq: Every day | ORAL | 0 refills | Status: AC

## 2022-03-07 MED ORDER — METHOCARBAMOL 500 MG PO TABS
500.0000 mg | ORAL_TABLET | Freq: Four times a day (QID) | ORAL | 0 refills | Status: AC
Start: 2022-03-07 — End: ?

## 2022-03-07 MED ORDER — KETOROLAC TROMETHAMINE 30 MG/ML IJ SOLN
30.0000 mg | Freq: Once | INTRAMUSCULAR | Status: AC
  Administered 2022-03-07: 30 mg via INTRAMUSCULAR
  Filled 2022-03-07: qty 1

## 2022-03-07 NOTE — ED Notes (Signed)
Patient transported to X-ray 

## 2022-03-07 NOTE — ED Provider Notes (Signed)
? ?[facility] ?Provider Note ? ?Patient Contact: 7:11 PM (approximate) ? ? ?History  ? ?Back Pain ? ? ?HPI ? ?Julie Jenkins is a 31 y.o. female who presents the emergency department complaining of right rib pain.  Patient was seen in this department 2 weeks ago, was admitted for slightly elevated troponin with reports of chest pain and shortness of breath.  Patient states that symptoms really began 3 weeks ago after she sneezed.  She had a sharp pain/popping sensation in her right chest wall and had had ongoing pain and shortness of breath since.  Patient was evaluated while in the hospital with cardiology and determined that source of pain was not heart related.  Patient was placed on a muscle relaxer for her rib pain and states that this intermittently helped but she has had persistent pain in the same distribution since her discharge.  There is no other associated complaints at this time.  She is here primarily for her rib pain. ?  ? ? ?Physical Exam  ? ?Triage Vital Signs: ?ED Triage Vitals  ?Enc Vitals Group  ?   BP 03/07/22 1834 (!) 158/90  ?   Pulse Rate 03/07/22 1834 71  ?   Resp 03/07/22 1834 18  ?   Temp 03/07/22 1834 98.3 ?F (36.8 ?C)  ?   Temp Source 03/07/22 1834 Oral  ?   SpO2 03/07/22 1834 97 %  ?   Weight 03/07/22 1831 253 lb 8.5 oz (115 kg)  ?   Height 03/07/22 1831 5\' 4"  (1.626 m)  ?   Head Circumference --   ?   Peak Flow --   ?   Pain Score 03/07/22 1831 7  ?   Pain Loc --   ?   Pain Edu? --   ?   Excl. in [place]? --   ? ? ?Most recent vital signs: ?Vitals:  ? 03/07/22 1834  ?BP: (!) 158/90  ?Pulse: 71  ?Resp: 18  ?Temp: 98.3 ?F (36.8 ?C)  ?SpO2: 97%  ? ? ? ?General: Alert and in no acute distress.  ?Neck: No stridor. No cervical spine tenderness to palpation.  ?Cardiovascular:  Good peripheral perfusion ?Respiratory: Normal respiratory effort without tachypnea or retractions. Lungs CTAB. Good air entry to the bases with no decreased or absent breath  sounds. ?Gastrointestinal: Bowel sounds ?4 quadrants. Soft and nontender to palpation. No guarding or rigidity. No palpable masses. No distention. No CVA tenderness. ?Musculoskeletal: Full range of motion to all extremities.  Patient with no visible abnormalities to the chest wall.  She is tender to palpation along the fifth rib and intercostal margin rating from the front of her chest through to the posterior chest wall.  No other tenderness to palpation.  However palpation does reproduce the patient's symptoms. ?Neurologic:  No gross focal neurologic deficits are appreciated.  ?Skin:   No rash noted ?Other: ? ? ?ED Results / Procedures / Treatments  ? ?Labs ?(all labs ordered are listed, but only abnormal results are displayed) ?Labs Reviewed - No data to display ? ? ?EKG ? ? ? ? ?RADIOLOGY ? ?I personally viewed and evaluated these images as part of my medical decision making, as well as reviewing the written report by the radiologist. ? ?ED Provider Interpretation: No acute findings specifically no rib fracture and no cardiopulmonary abnormality on chest x-ray with rib series. ? ?DG Ribs Unilateral W/Chest Right ? ?Result Date: 03/07/2022 ?CLINICAL DATA:  Right chest pain EXAM: RIGHT RIBS AND  CHEST - 3+ VIEW COMPARISON:  02/22/2022 FINDINGS: Transverse diameter of heart is increased. Central pulmonary vessels are prominent. There is no focal pulmonary consolidation. There is no pleural effusion or pneumothorax. No displaced fractures are seen in the right ribs. IMPRESSION: No displaced fractures are seen in the right ribs. There is no focal pulmonary consolidation. There is no pleural effusion or pneumothorax. Electronically Signed   By: Elmer Picker M.D.   On: 03/07/2022 19:55   ? ?PROCEDURES: ? ?Critical Care performed: No ? ?Procedures ? ? ?MEDICATIONS ORDERED IN ED: ?Medications  ?ketorolac (TORADOL) 30 MG/ML injection 30 mg (has no administration in time range)  ? ? ? ?IMPRESSION / MDM / ASSESSMENT AND  PLAN / ED COURSE  ?I reviewed the triage vital signs and the nursing notes. ?             ?               ? ?Differential diagnosis includes, but is not limited to, costochondritis, rib fracture ? ? ?Patient's diagnosis is consistent with right rib pain.  Patient developed right rib pain roughly 3 weeks ago after sneezing.  She was seen 2 weeks ago, was admitted due to slightly elevated troponins.  She had a reassuring cardiac work-up and the majority of her symptoms have improved with the exception of her ongoing rib pain.  Patient states that she had intermittent relief from muscle relaxer but the symptoms never went away.  She was tender in the intercostal margins.  X-ray was performed today with no acute findings.  No concern for cardiac involvement given her recent reassuring work-up and reproducible palpable symptoms on exam.  Patient will be given Toradol, meloxicam at home..  Follow-up with primary care as needed.  Patient is given ED precautions to return to the ED for any worsening or new symptoms. ? ? ? ?  ? ? ?FINAL CLINICAL IMPRESSION(S) / ED DIAGNOSES  ? ?Final diagnoses:  ?Costochondritis  ? ? ? ?Rx / DC Orders  ? ?ED Discharge Orders   ? ?      Ordered  ?  meloxicam (MOBIC) 15 MG tablet  Daily       ? 03/07/22 2031  ?  methocarbamol (ROBAXIN) 500 MG tablet  4 times daily       ? 03/07/22 2031  ? ?  ?  ? ?  ? ? ? ?Note:  This document was prepared using Dragon voice recognition software and may include unintentional dictation errors. ?  ?Darletta Moll, PA-C ?03/07/22 2038 ? ?  ?Arta Silence, MD ?03/07/22 2300 ? ?

## 2022-03-07 NOTE — ED Triage Notes (Signed)
First Nurse:  C/O mid - upper back pain and upper chest pain x 2 weeks.  States has been seen and treated for same, given muscle relaxers that are not helping.  States has not been to work in 2 days. ? ?AAOx3.  Skin warm and dyr. NAD.  Posture upright and relaxed. NAD ?

## 2022-04-10 ENCOUNTER — Telehealth: Payer: Self-pay | Admitting: Pharmacy Technician

## 2022-04-10 ENCOUNTER — Other Ambulatory Visit: Payer: Self-pay

## 2022-04-10 NOTE — Telephone Encounter (Signed)
Patient only signed DOH Attestation.  Would need to provide current year's household income if PAP medications were needed. ? ?Leib Elahi J. Zahir Eisenhour ?Patient Advocate Specialist ?[facility]  ?

## 2022-12-08 ENCOUNTER — Other Ambulatory Visit: Payer: Self-pay

## 2023-05-14 IMAGING — CR DG RIBS W/ CHEST 3+V*R*
3 series · 3 of 3 positions shown · non-contrast
Comparison: 02/22/2022

CLINICAL DATA: Right chest pain

EXAM:
RIGHT RIBS AND CHEST - 3+ VIEW

[chest pa]
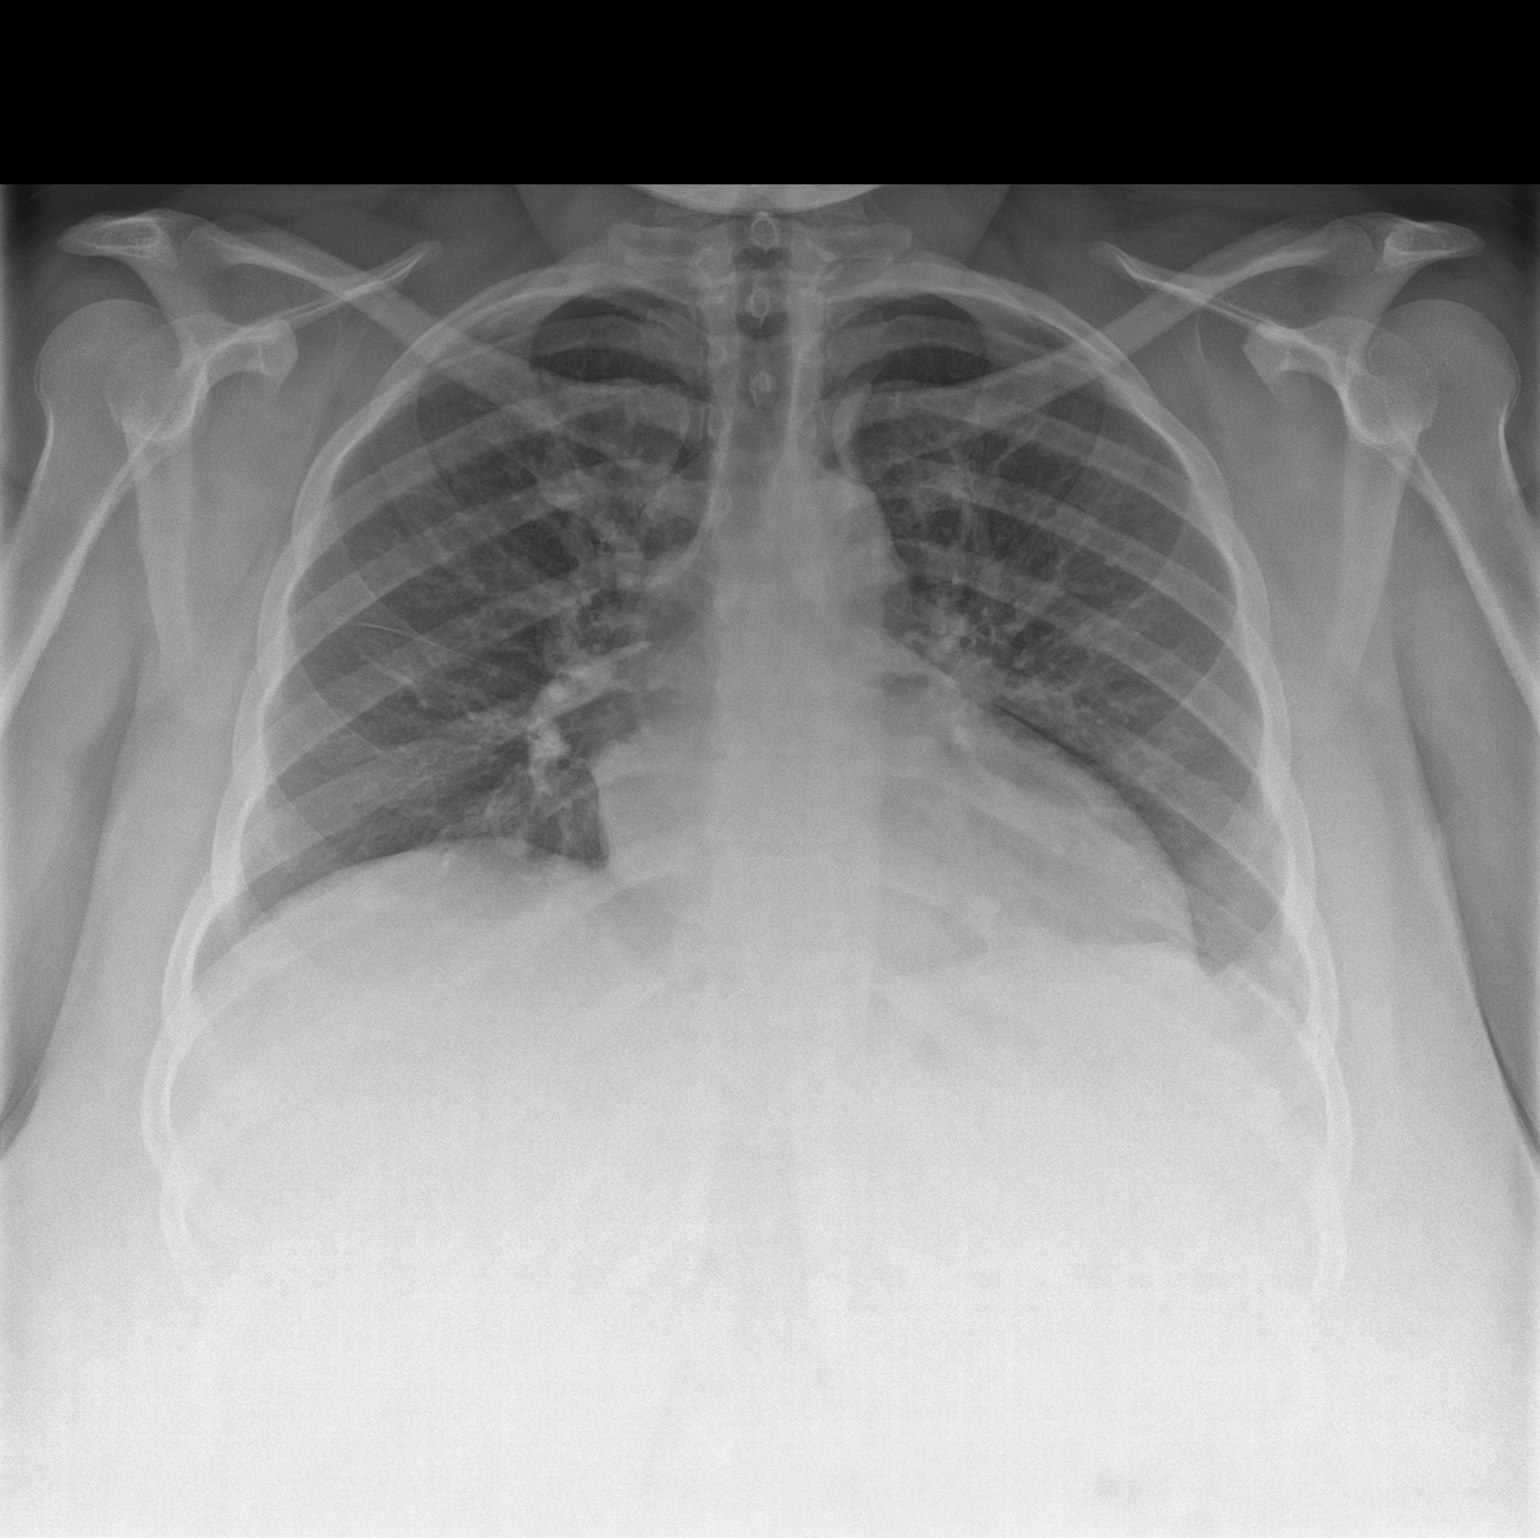

[rib ap]
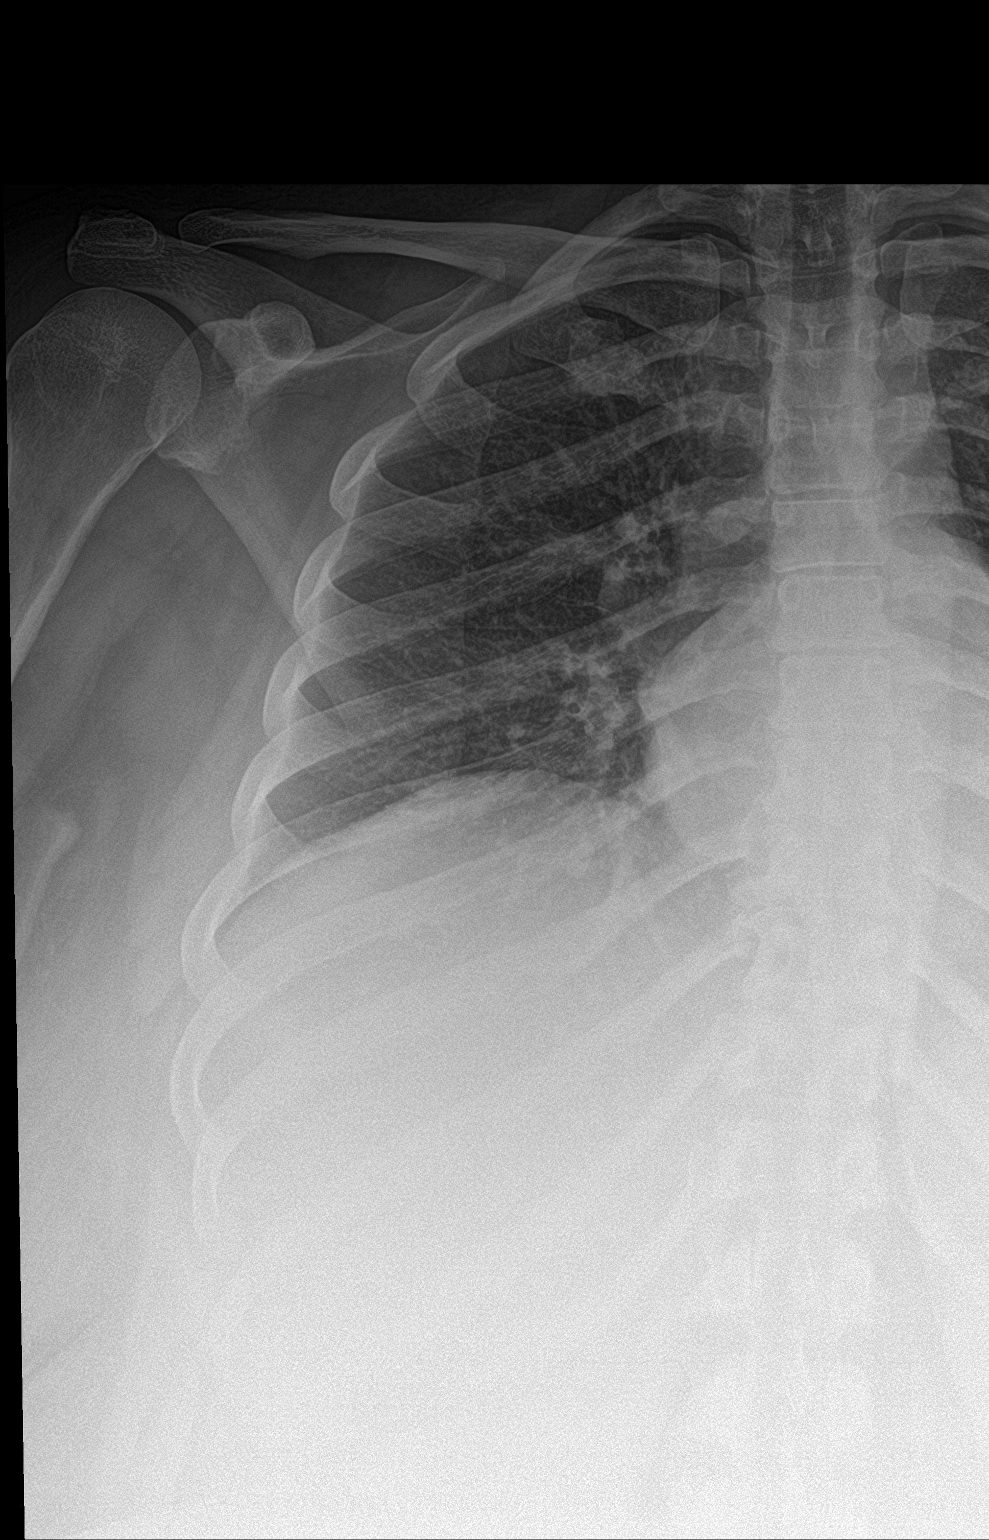

[rib ap obl]
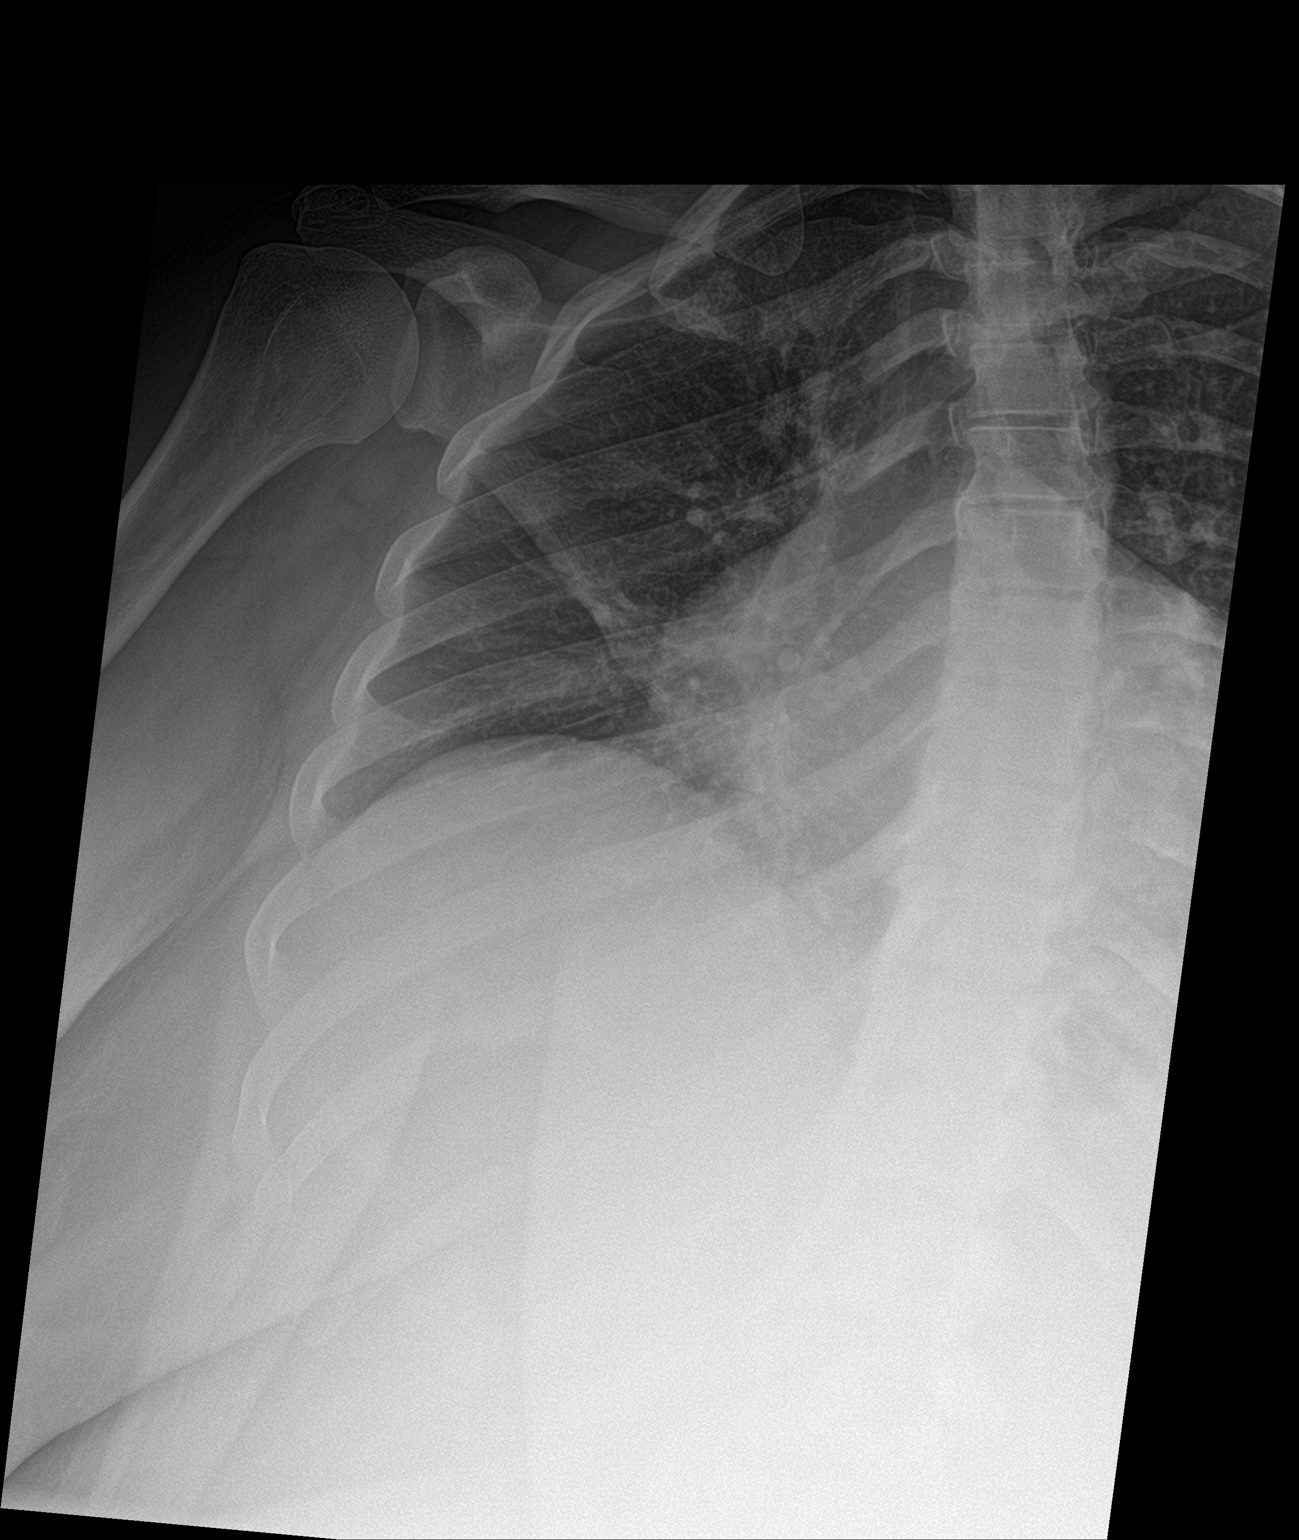

[3 of 3 positions shown; findings below may reference images not displayed]

FINDINGS: Transverse diameter of heart is increased. Central pulmonary vessels
are prominent. There is no focal pulmonary consolidation. There is
no pleural effusion or pneumothorax. No displaced fractures are seen
in the right ribs.
IMPRESSION: No displaced fractures are seen in the right ribs. There is no focal
pulmonary consolidation. There is no pleural effusion or
pneumothorax.

## 2024-05-16 ENCOUNTER — Telehealth: Payer: Self-pay

## 2024-05-16 NOTE — Telephone Encounter (Signed)
 Patient was identified as falling into the [place] Measure - Diabetes.   Patient was: Patient is not currently using our practice.
